# Patient Record
Sex: Female | Born: 1975 | Race: White | Hispanic: No | Marital: Married
Health system: Southern US, Community
[De-identification: ages and names within clinical notes are randomized; demographics above are authoritative.]

---

## 2013-05-01 ENCOUNTER — Other Ambulatory Visit (HOSPITAL_COMMUNITY)
Admission: RE | Admit: 2013-05-01 | Discharge: 2013-05-01 | Disposition: A | Discharge status: Home / Self Care | Payer: Commercial Managed Care - PPO | Source: Ambulatory Visit | Attending: Obstetrics & Gynecology | Admitting: Obstetrics & Gynecology

## 2013-05-01 DIAGNOSIS — Z1151 Encounter for screening for human papillomavirus (HPV): Secondary | ICD-10-CM | POA: Insufficient documentation

## 2013-05-01 DIAGNOSIS — N760 Acute vaginitis: Secondary | ICD-10-CM | POA: Insufficient documentation

## 2013-05-01 DIAGNOSIS — Z113 Encounter for screening for infections with a predominantly sexual mode of transmission: Secondary | ICD-10-CM | POA: Insufficient documentation

## 2013-05-01 DIAGNOSIS — Z01419 Encounter for gynecological examination (general) (routine) without abnormal findings: Secondary | ICD-10-CM | POA: Insufficient documentation

## 2013-11-04 ENCOUNTER — Other Ambulatory Visit: Payer: Self-pay | Admitting: Oral Surgery

## 2013-11-04 DIAGNOSIS — M26609 Unspecified temporomandibular joint disorder, unspecified side: Secondary | ICD-10-CM

## 2013-11-08 ENCOUNTER — Ambulatory Visit
Admission: RE | Admit: 2013-11-08 | Discharge: 2013-11-08 | Disposition: A | Discharge status: Home / Self Care | Payer: Commercial Managed Care - PPO | Source: Ambulatory Visit | Attending: Oral Surgery | Admitting: Oral Surgery

## 2013-11-08 ENCOUNTER — Encounter (INDEPENDENT_AMBULATORY_CARE_PROVIDER_SITE_OTHER): Payer: Self-pay

## 2013-11-08 DIAGNOSIS — M26609 Unspecified temporomandibular joint disorder, unspecified side: Secondary | ICD-10-CM

## 2014-01-27 ENCOUNTER — Other Ambulatory Visit: Payer: Self-pay | Admitting: Obstetrics & Gynecology

## 2014-01-27 DIAGNOSIS — N644 Mastodynia: Secondary | ICD-10-CM

## 2014-02-02 ENCOUNTER — Ambulatory Visit
Admission: RE | Admit: 2014-02-02 | Discharge: 2014-02-02 | Disposition: A | Discharge status: Home / Self Care | Payer: Commercial Managed Care - PPO | Source: Ambulatory Visit | Attending: Obstetrics & Gynecology | Admitting: Obstetrics & Gynecology

## 2014-02-02 DIAGNOSIS — N644 Mastodynia: Secondary | ICD-10-CM

## 2014-02-02 IMAGING — MG MM DIGITAL DIAGNOSTIC BILAT CAD
7 series · 7 of 7 positions shown · non-contrast
Comparison: None.

CLINICAL DATA: Patient presents for evaluation of focal tenderness
within the upper inner left breast.

EXAM:
DIGITAL DIAGNOSTIC  BILATERAL MAMMOGRAM WITH CAD
ULTRASOUND LEFT BREAST

[L CC (1 of 2)]
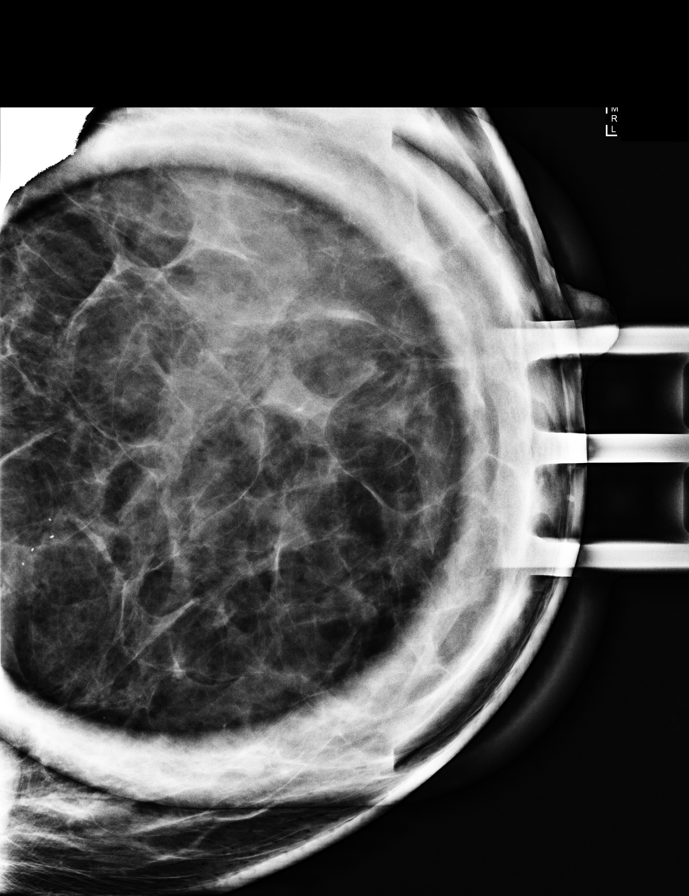

[L MLO]
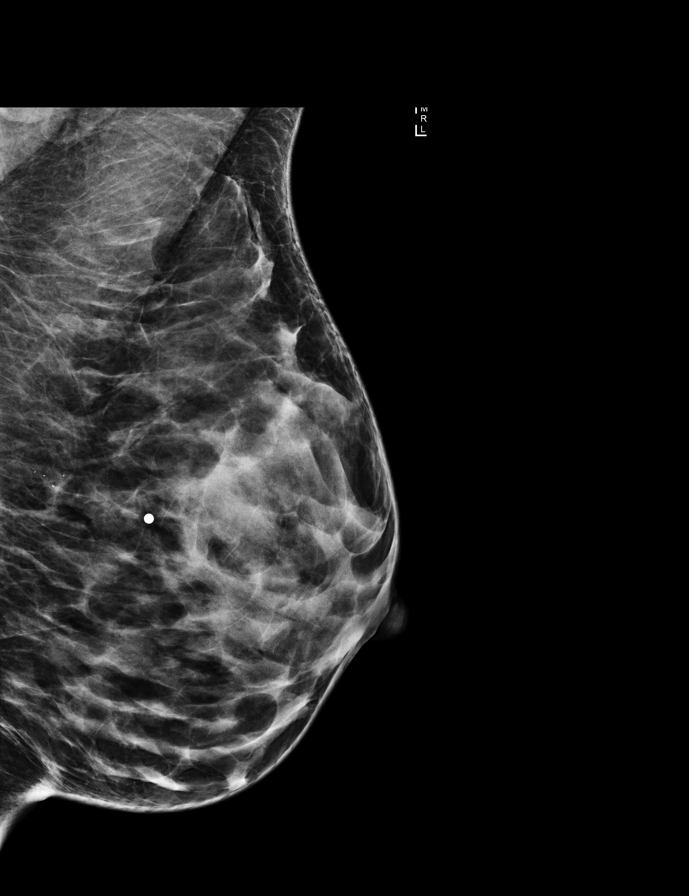

[L ML]
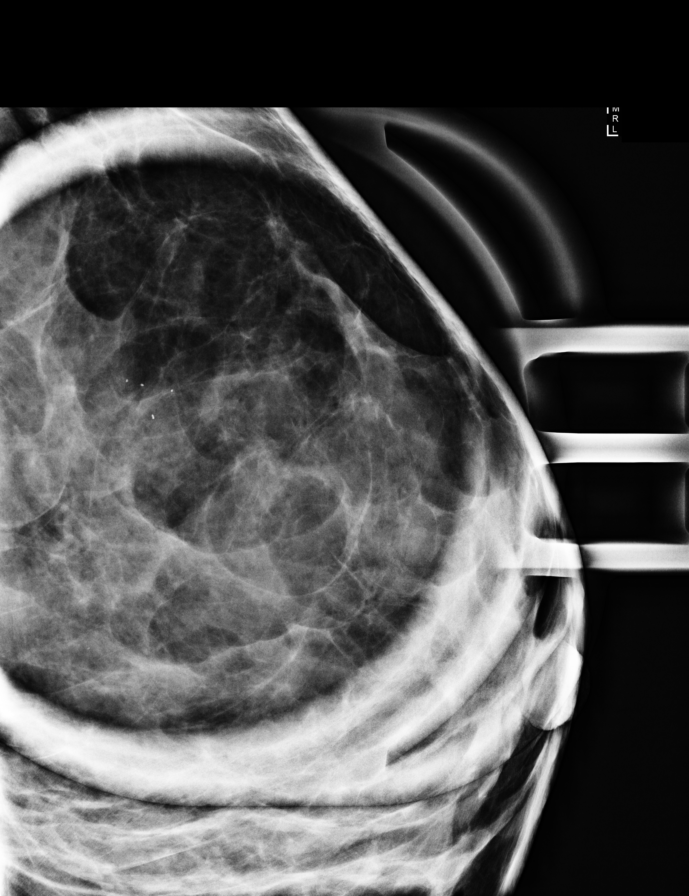

[R CC]
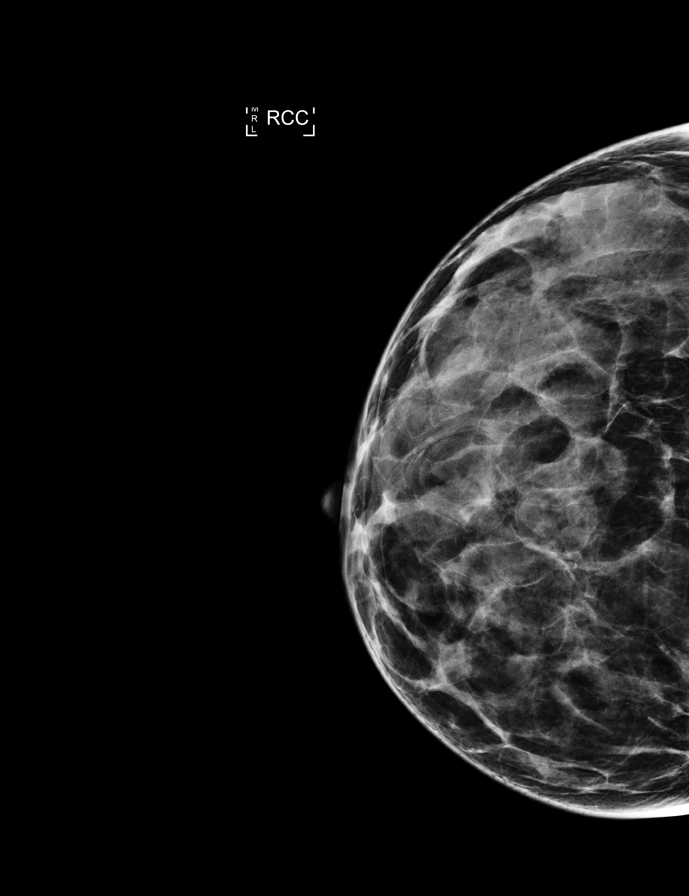

[R MLO]
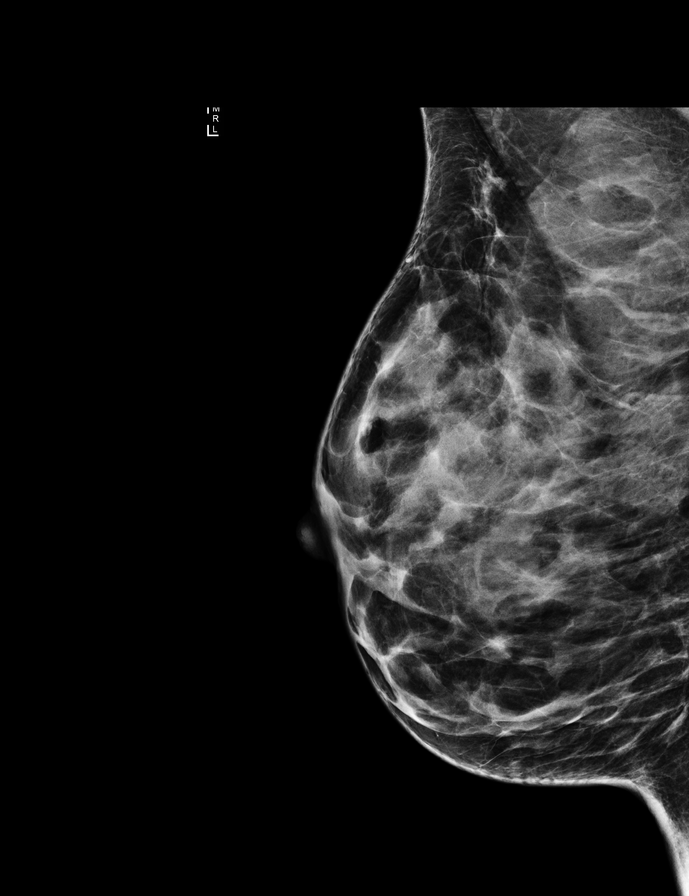

[L TAN]
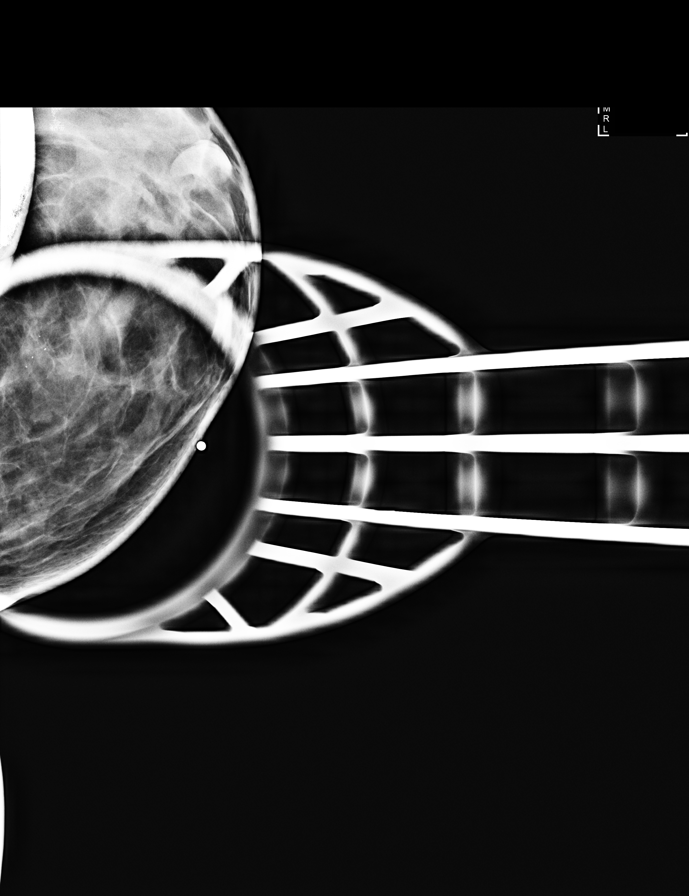

[L CC (2 of 2)]
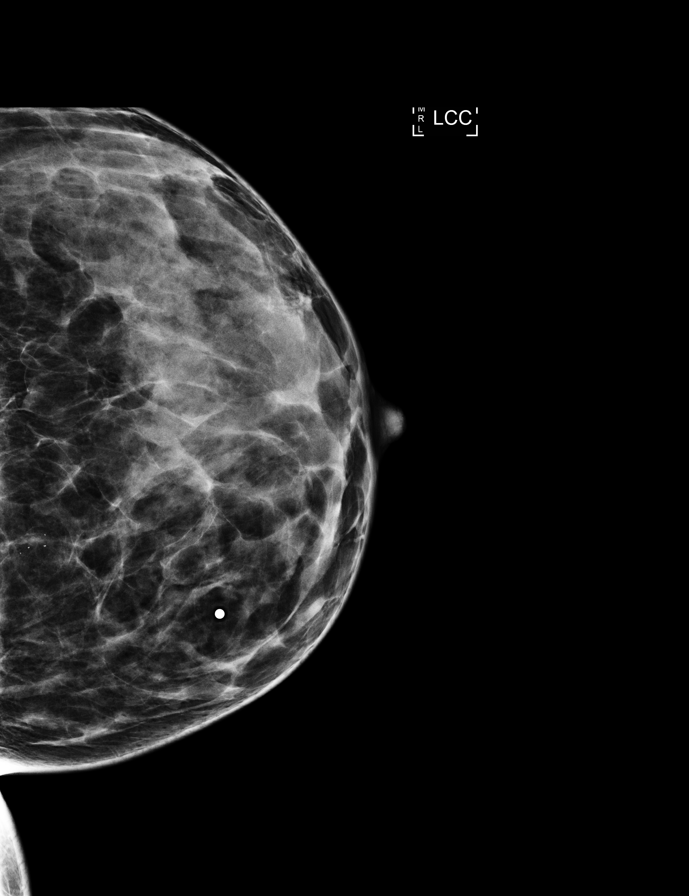

[7 of 7 positions shown; findings below may reference images not displayed]

ACR Breast Density Category c: The breast tissue is heterogeneously
dense, which may obscure small masses.
FINDINGS: No concerning masses or architectural distortion identified within
either breast or on the spot tangential view of the left breast at
the site of focal tenderness. Additionally there are loosely grouped
round calcifications within the upper inner left breast posterior
depth further evaluated with spot compression magnification views.

Mammographic images were processed with CAD.

On physical exam, I palpate no discrete mass within the upper inner
left breast at the site of focal tenderness.

Ultrasound is performed, showing no concerning mass at the site of
focal tenderness within the upper inner left breast.
IMPRESSION: No concerning findings at the site of focal tenderness within the
upper inner left breast.

Probably benign left breast calcifications.

RECOMMENDATION:
Left breast diagnostic mammogram with magnification views in 6
months to ensure stability of probably benign left breast
calcifications.

I have discussed the findings and recommendations with the patient.
Results were also provided in writing at the conclusion of the
visit. If applicable, a reminder letter will be sent to the patient
regarding the next appointment.

BI-RADS CATEGORY  3: Probably benign.
